# Patient Record
Sex: Female | Born: 2000 | Race: Black or African American | Hispanic: No | Marital: Single | Smoking: Never smoker
Health system: Southern US, Community
[De-identification: ages and names within clinical notes are randomized; demographics above are authoritative.]

## PROBLEM LIST (undated history)

## (undated) DIAGNOSIS — Z789 Other specified health status: Secondary | ICD-10-CM

## (undated) HISTORY — PX: NO PAST SURGERIES: SHX2092

---

## 2019-02-22 ENCOUNTER — Other Ambulatory Visit: Payer: Self-pay

## 2019-02-22 ENCOUNTER — Encounter (HOSPITAL_COMMUNITY): Payer: Self-pay

## 2019-02-22 ENCOUNTER — Emergency Department (HOSPITAL_COMMUNITY)
Admission: EM | Admit: 2019-02-22 | Discharge: 2019-02-23 | Disposition: A | Discharge status: Home / Self Care | Payer: Federal, State, Local not specified - PPO | Attending: Emergency Medicine | Admitting: Emergency Medicine

## 2019-02-22 DIAGNOSIS — Z20828 Contact with and (suspected) exposure to other viral communicable diseases: Secondary | ICD-10-CM | POA: Diagnosis not present

## 2019-02-22 DIAGNOSIS — J181 Lobar pneumonia, unspecified organism: Secondary | ICD-10-CM | POA: Diagnosis not present

## 2019-02-22 DIAGNOSIS — J020 Streptococcal pharyngitis: Secondary | ICD-10-CM | POA: Insufficient documentation

## 2019-02-22 DIAGNOSIS — R509 Fever, unspecified: Secondary | ICD-10-CM | POA: Diagnosis present

## 2019-02-22 DIAGNOSIS — J189 Pneumonia, unspecified organism: Secondary | ICD-10-CM

## 2019-02-22 HISTORY — DX: Other specified health status: Z78.9

## 2019-02-22 NOTE — ED Triage Notes (Addendum)
Pt c/o fever, chills and sore throat since this a.m. Patient is a Ship broker at [employer]. Last Tylenol was taken @ 23:00pm. (dosage 1000mg )

## 2019-02-23 ENCOUNTER — Emergency Department (HOSPITAL_COMMUNITY): Payer: Federal, State, Local not specified - PPO

## 2019-02-23 LAB — I-STAT BETA HCG BLOOD, ED (MC, WL, AP ONLY): I-stat hCG, quantitative: <5 m[IU]/mL (ref <5)

## 2019-02-23 LAB — BASIC METABOLIC PANEL
Anion gap: 13 (ref 5–15)
BUN: 10 mg/dL (ref 6–20)
CO2: 19 mmol/L — ABNORMAL LOW (ref 22–32)
Calcium: 9.5 mg/dL (ref 8.9–10.3)
Chloride: 103 mmol/L (ref 98–111)
Creatinine, Ser: 1.01 mg/dL — ABNORMAL HIGH (ref 0.44–1.00)
GFR calc Af Amer: >60 mL/min (ref >60)
GFR calc non Af Amer: >60 mL/min (ref >60)
Glucose, Bld: 125 mg/dL — ABNORMAL HIGH (ref 70–99)
Potassium: 3.2 mmol/L — ABNORMAL LOW (ref 3.5–5.1)
Sodium: 135 mmol/L (ref 135–145)

## 2019-02-23 LAB — CBC
HCT: 42 % (ref 36.0–46.0)
Hemoglobin: 14.3 g/dL (ref 12.0–15.0)
MCH: 30.8 pg (ref 26.0–34.0)
MCHC: 34 g/dL (ref 30.0–36.0)
MCV: 90.3 fL (ref 80.0–100.0)
Platelets: 293 10*3/uL (ref 150–400)
RBC: 4.65 MIL/uL (ref 3.87–5.11)
RDW: 11.8 % (ref 11.5–15.5)
WBC: 13 10*3/uL — ABNORMAL HIGH (ref 4.0–10.5)
nRBC: 0 % (ref 0.0–0.2)

## 2019-02-23 LAB — TROPONIN I (HIGH SENSITIVITY): Troponin I (High Sensitivity): 3 ng/L (ref <18)

## 2019-02-23 LAB — GROUP A STREP BY PCR: Group A Strep by PCR: DETECTED — AB

## 2019-02-23 LAB — SARS CORONAVIRUS 2 BY RT PCR (HOSPITAL ORDER, PERFORMED IN ~~LOC~~ HOSPITAL LAB): SARS Coronavirus 2: NEGATIVE

## 2019-02-23 LAB — MAGNESIUM: Magnesium: 1.6 mg/dL — ABNORMAL LOW (ref 1.7–2.4)

## 2019-02-23 MED ORDER — AZITHROMYCIN 250 MG PO TABS
1000.0000 mg | ORAL_TABLET | Freq: Once | ORAL | Status: AC
  Administered 2019-02-23: 1000 mg via ORAL
  Filled 2019-02-23: qty 4

## 2019-02-23 MED ORDER — AMOXICILLIN-POT CLAVULANATE 875-125 MG PO TABS
1.0000 | ORAL_TABLET | Freq: Once | ORAL | Status: AC
  Administered 2019-02-23: 1 via ORAL
  Filled 2019-02-23: qty 1

## 2019-02-23 MED ORDER — POTASSIUM CHLORIDE CRYS ER 20 MEQ PO TBCR
40.0000 meq | EXTENDED_RELEASE_TABLET | Freq: Once | ORAL | Status: AC
  Administered 2019-02-23: 40 meq via ORAL
  Filled 2019-02-23: qty 2

## 2019-02-23 MED ORDER — SODIUM CHLORIDE 0.9 % IV BOLUS (SEPSIS)
1000.0000 mL | Freq: Once | INTRAVENOUS | Status: AC
  Administered 2019-02-23: 01:00:00 1000 mL via INTRAVENOUS

## 2019-02-23 MED ORDER — SODIUM CHLORIDE 0.9% FLUSH: 3.0000 mL | Freq: Once | INTRAVENOUS | Status: DC

## 2019-02-23 MED ORDER — AMOXICILLIN-POT CLAVULANATE 875-125 MG PO TABS: 1.0000 | ORAL_TABLET | Freq: Two times a day (BID) | ORAL | 0 refills | Status: AC

## 2019-02-23 MED ORDER — AZITHROMYCIN 250 MG PO TABS: 250.0000 mg | ORAL_TABLET | Freq: Every day | ORAL | 0 refills | Status: AC

## 2019-02-23 MED ORDER — MAGNESIUM SULFATE 2 GM/50ML IV SOLN
2.0000 g | Freq: Once | INTRAVENOUS | Status: AC
  Administered 2019-02-23: 2 g via INTRAVENOUS
  Filled 2019-02-23: qty 50

## 2019-02-23 NOTE — ED Notes (Signed)
Discharge instructions discussed with pt. Pt verbalized understanding with no questions at this time.  

## 2019-02-23 NOTE — Discharge Instructions (Signed)
You may alternate Tylenol 1000 mg every 6 hours as needed for pain and Ibuprofen 800 mg every 8 hours as needed for pain.  Please take Ibuprofen with food. ° °Steps to find a Primary Care Provider (PCP): ° °Call [phone] or [phone] to access "[facility] Find a Doctor Service." ° °2.  You may also go on the [facility] website at [email or web address] ° °3.  [facility] also frequently accepts new patients. ° °[facility]  °[address] °[place] [address] °[phone] ° °4.  There are also multiple [facility], [facility] and [facility] practices throughout the [place] that are frequently accepting new patients. You may find a clinic that is close to your home and contact them. ° °[facility] °[email or web address] °[phone] ° °[facility] °[email or web address].[facility] ° °[facility] °[email or web address] °[phone] ° °[facility] °[email or web address] °[phone] ° °5.  Local Health Departments also can provide primary care services. ° °[facility]  °[address] °[place] [address] °[phone] ° °[facility] °[address] °[place] [address] [phone] ° °[facility] °[address]  °[place]  [address] °[phone] ° ° °

## 2019-02-23 NOTE — ED Provider Notes (Signed)
TIME SEEN: 12:26 AM  CHIEF COMPLAINT: Fever, chills, sore throat, shortness of breath  HPI: Patient is a 18 year old female with no significant past medical history who presents to the emergency department with complaints of fever that started last night at 5:30 PM, chills, sore throat, shortness of breath.  No cough.  No vomiting or diarrhea.  No abdominal pain.  No dysuria, vaginal bleeding or discharge.  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.  No known exposures to COVID.  [place] but no other recent travel. Was on mefloquine intermittently.  No fevers or symptoms until now.  Her mother was concerned about malaria.  She took Tylenol approximately 30 minutes prior to arrival.  ROS: See HPI Constitutional: no fever  Eyes: no drainage  ENT: no runny nose   Cardiovascular:  no chest pain  Resp: no SOB  GI: no vomiting GU: no dysuria Integumentary: no rash  Allergy: no hives  Musculoskeletal: no leg swelling  Neurological: no slurred speech ROS otherwise negative  PAST MEDICAL HISTORY/PAST SURGICAL HISTORY:  Past Medical History:  Diagnosis Date  . No pertinent past medical history     MEDICATIONS:  Prior to Admission medications   Not on File    ALLERGIES:  No Known Allergies  SOCIAL HISTORY:  Social History   Tobacco Use  . Smoking status: Never Smoker  . Smokeless tobacco: Never Used  Substance Use Topics  . Alcohol use: Yes    FAMILY HISTORY: History reviewed. No pertinent family history.  EXAM: BP 120/75 (BP Location: Right Arm)   Pulse (!) 120   Temp (!) 102.3 F (39.1 C) (Oral)   Resp (!) 22   SpO2 100%  CONSTITUTIONAL: Alert and oriented and responds appropriately to questions. Well-appearing; well-nourished HEAD: Normocephalic EYES: Conjunctivae clear, pupils appear equal, EOMI ENT: normal nose; moist mucous membranes; mild pharyngeal erythema without petechiae, no tonsillar  hypertrophy or exudate, no uvular deviation, no unilateral swelling, no trismus or drooling, no muffled voice, normal phonation, no stridor, no dental caries present, no drainable dental abscess noted, no Ludwig's angina, tongue sits flat in the bottom of the mouth, no angioedema, no facial erythema or warmth, no facial swelling; no pain with movement of the neck. NECK: Supple, no meningismus, no nuchal rigidity, no LAD  CARD: Regular and tachycardic; S1 and S2 appreciated; no murmurs, no clicks, no rubs, no gallops RESP: Normal chest excursion without splinting or tachypnea; breath sounds clear and equal bilaterally; no wheezes, no rhonchi, no rales, no hypoxia or respiratory distress, speaking full sentences ABD/GI: Normal bowel sounds; non-distended; soft, non-tender, no rebound, no guarding, no peritoneal signs, no hepatosplenomegaly BACK:  The back appears normal and is non-tender to palpation, there is no CVA tenderness EXT: Normal ROM in all joints; non-tender to palpation; no edema; normal capillary refill; no cyanosis, no calf tenderness or swelling    SKIN: Normal color for age and race; warm; no rash NEURO: Moves all extremities equally PSYCH: The patient's mood and manner are appropriate. Grooming and personal hygiene are appropriate.  MEDICAL DECISION MAKING: Patient here with symptoms of fever, chills, shortness of breath, sore throat.  She is tachycardic here and EKG showed prolonged QT interval.  No known history of the same.  Will check electrolytes.  Took Tylenol prior to arrival.  Will give IV fluids.  Strep swab sent in triage has come back positive her chest x-ray is also concerning for  developing left lower lobe pneumonia.  I feel she is high risk for COVID.  We will send COVID swab tonight as this will change her management depending if she has viral versus bacterial pneumonia.  Very low suspicion at this time that this is malaria given her travel history was 9 months ago and she  has 2 potential sources of infection today.  Discussed this with patient.  Will repeat EKG once heart rate has improved.  ED PROGRESS: Potassium level 3.2.  Will give oral replacement.  Magnesium level is 1.6.  Will give 2 g IV magnesium.   Repeat EKG shows that her rate has improved and her QT interval is back to normal.     Patient's COVID swab is negative.  Will treat for bacterial pneumonia and strep pharyngitis with Augmentin and azithromycin.  Recommended alternating Tylenol and Motrin for fever and pain.  Recommended rest and increase fluid intake.  Will discharge home.  Given PCP follow-up.   At this time, I do not feel there is any life-threatening condition present. I have reviewed and discussed all results (EKG, imaging, lab, urine as appropriate) and exam findings with patient/family. I have reviewed nursing notes and appropriate previous records.  I feel the patient is safe to be discharged home without further emergent workup and can continue workup as an outpatient as needed. Discussed usual and customary return precautions. Patient/family verbalize understanding and are comfortable with this plan.  Outpatient follow-up has been provided as needed. All questions have been answered.   Niani Ellison Hughs N'Garnim was evaluated in Emergency Department on 02/23/2019 for the symptoms described in the history of present illness. She was evaluated in the context of the global COVID-19 pandemic, which necessitated consideration that the patient might be at risk for infection with the SARS-CoV-2 virus that causes COVID-19. Institutional protocols and algorithms that pertain to the evaluation of patients at risk for COVID-19 are in a state of rapid change based on information released by regulatory bodies including the CDC and federal and state organizations. These policies and algorithms were followed during the patient's care in the ED.    EKG Interpretation  Date/Time:  Monday February 22 2019  23:55:39 EDT Ventricular Rate:  117 PR Interval:    QRS Duration: 70 QT Interval:  442 QTC Calculation: 616 R Axis:   79 Text Interpretation:  Sinus tachycardia Nonspecific T wave abnormality Prolonged QT Abnormal ECG No old tracing to compare Confirmed by Lianah Peed, Cyril Mourning [phone]) on 02/23/2019 12:25:28 AM        CRITICAL CARE Performed by: Cyril Mourning Raniah Karan   Total critical care time: 45 minutes  Critical care time was exclusive of separately billable procedures and treating other patients.  Critical care was necessary to treat or prevent imminent or life-threatening deterioration.  Critical care was time spent personally by me on the following activities: development of treatment plan with patient and/or surrogate as well as nursing, discussions with consultants, evaluation of patient's response to treatment, examination of patient, obtaining history from patient or surrogate, ordering and performing treatments and interventions, ordering and review of laboratory studies, ordering and review of radiographic studies, pulse oximetry and re-evaluation of patient's condition.    Alixander Rallis, Delice Bison, DO 02/23/19 [phone]

## 2019-02-23 NOTE — ED Notes (Signed)
Lab to add MG to previous collection

## 2019-06-11 ENCOUNTER — Other Ambulatory Visit: Payer: Self-pay

## 2019-06-11 DIAGNOSIS — Z20822 Contact with and (suspected) exposure to covid-19: Secondary | ICD-10-CM

## 2019-06-12 LAB — NOVEL CORONAVIRUS, NAA: SARS-CoV-2, NAA: NOT DETECTED

## 2021-01-02 IMAGING — DX DG CHEST 1V PORT
1 series · 1 of 1 positions shown · non-contrast
Comparison: None.

CLINICAL DATA: Shortness of breath, fever, chills and sore throat

EXAM:
PORTABLE CHEST 1 VIEW

[chest ap]
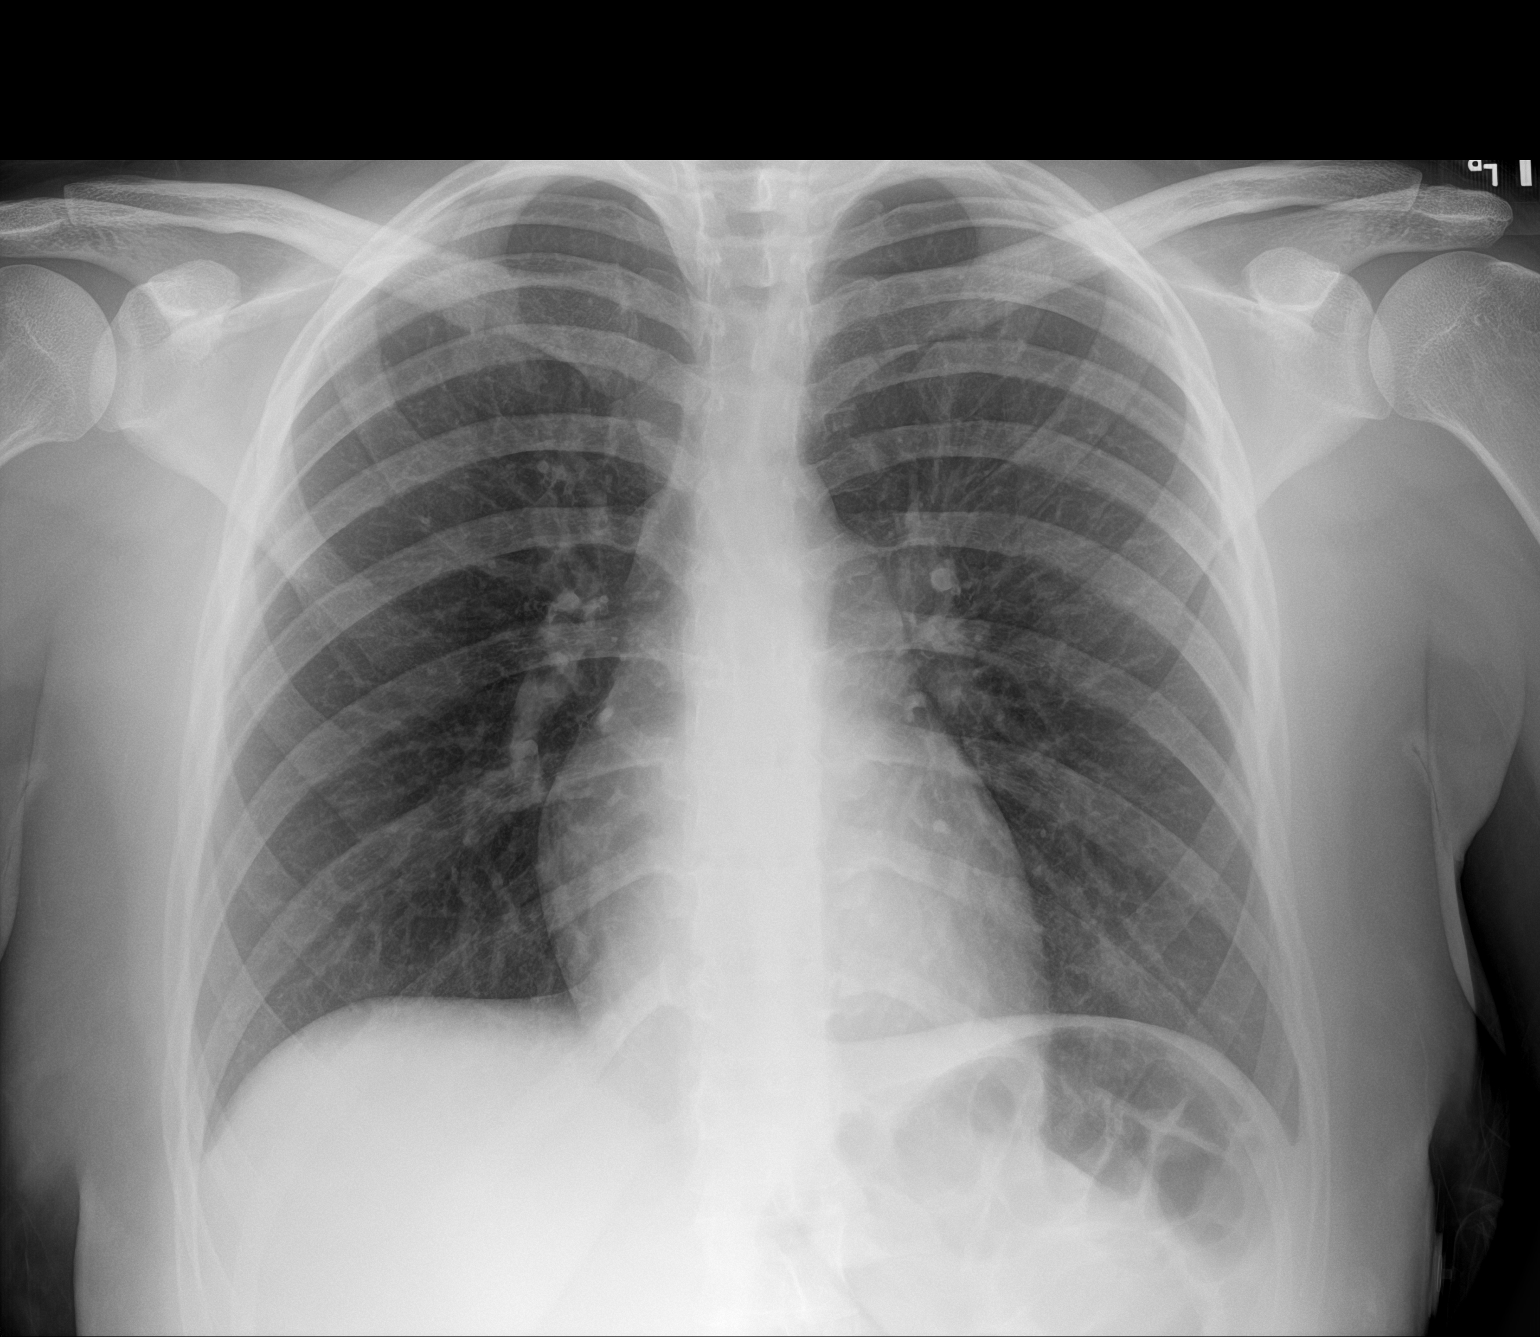

[1 of 1 positions shown; findings below may reference images not displayed]

FINDINGS: Patchy consolidation with air bronchograms seen in the left lung
base. No pneumothorax or effusion. Cardiomediastinal contours are
unremarkable for the portable technique. No acute osseous or soft
tissue abnormality.
IMPRESSION: Findings concerning for left lower lobe pneumonia.
